# Patient Record
Sex: Female | Born: 1995 | Race: Black or African American | Hispanic: No | Marital: Single | Smoking: Never smoker
Health system: Southern US, Community
[De-identification: ages and names within clinical notes are randomized; demographics above are authoritative.]

## PROBLEM LIST (undated history)

## (undated) DIAGNOSIS — T7840XA Allergy, unspecified, initial encounter: Secondary | ICD-10-CM

## (undated) DIAGNOSIS — R87612 Low grade squamous intraepithelial lesion on cytologic smear of cervix (LGSIL): Principal | ICD-10-CM

## (undated) DIAGNOSIS — N390 Urinary tract infection, site not specified: Secondary | ICD-10-CM

## (undated) DIAGNOSIS — A749 Chlamydial infection, unspecified: Secondary | ICD-10-CM

## (undated) HISTORY — DX: Low grade squamous intraepithelial lesion on cytologic smear of cervix (LGSIL): R87.612

## (undated) HISTORY — DX: Chlamydial infection, unspecified: A74.9

## (undated) HISTORY — DX: Allergy, unspecified, initial encounter: T78.40XA

## (undated) HISTORY — PX: MYRINGOTOMY: SUR874

---
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
## 2015-04-03 ENCOUNTER — Encounter: Payer: Self-pay | Admitting: Primary Care

## 2015-04-03 ENCOUNTER — Ambulatory Visit (INDEPENDENT_AMBULATORY_CARE_PROVIDER_SITE_OTHER): Admitting: Primary Care

## 2015-04-03 VITALS — BP 116/68 | HR 87 | Temp 98.5°F | Ht 61.25 in | Wt 123.8 lb

## 2015-04-03 DIAGNOSIS — N898 Other specified noninflammatory disorders of vagina: Secondary | ICD-10-CM

## 2015-04-03 NOTE — Patient Instructions (Signed)
We will send off your specimens and notify you of the results. It may take 1-2 days.  I will be in touch with you once your results have returned.  It was a pleasure meeting you!

## 2015-04-03 NOTE — Progress Notes (Signed)
Pre visit review using our clinic review tool, if applicable. No additional management support is needed unless otherwise documented below in the visit note. 

## 2015-04-03 NOTE — Progress Notes (Signed)
   Subjective:    Patient ID: Rose Mitchell, female    DOB: 08/16/1995, [age] y.o.   MRN: [id number]  HPI  Rose Mitchell is a 19 year old female who presents today with a chief complaint of vaginal discharge. She also reports symptoms of vaginal itching. Her discharge is a milky white color. Denies fevers, urinary symptoms, abdominal pain. Her symptoms have been present for the past 3 days. She had unprotected intercourse Sunday this week.   Review of Systems  Constitutional: Negative for fever and chills.  Gastrointestinal: Negative for nausea and abdominal pain.  Genitourinary: Positive for vaginal discharge. Negative for dysuria and frequency.       Vaginal itching       Past Medical History  Diagnosis Date  . UTI (urinary tract infection)     Social History   Social History  . Marital Status: Single    Spouse Name: N/A  . Number of Children: N/A  . Years of Education: N/A   Occupational History  . Not on file.   Social History Main Topics  . Smoking status: Never Smoker   . Smokeless tobacco: Never Used  . Alcohol Use: No  . Drug Use: No  . Sexual Activity: No   Other Topics Concern  . Not on file   Social History Narrative    No past surgical history on file.  Family History  Problem Relation Age of Onset  . Diabetes Father     Allergies  Allergen Reactions  . Amoxicillin Rash    No current outpatient prescriptions on file prior to visit.   No current facility-administered medications on file prior to visit.    BP 116/68 mmHg  Pulse 87  Temp(Src) 98.5 F (36.9 C) (Oral)  Ht 5' 1.25" (1.556 m)  Wt 123 lb 12.8 oz (56.155 kg)  BMI 23.19 kg/m2  SpO2 99%  LMP 03/10/2015    Objective:   Physical Exam  Constitutional: She appears well-nourished.  Cardiovascular: Normal rate and regular rhythm.   Pulmonary/Chest: Effort normal and breath sounds normal.  Genitourinary: Cervix exhibits discharge. Cervix exhibits no motion tenderness. Vaginal discharge  found.  Skin: Skin is warm and dry.          Assessment & Plan:  Vaginal Discharge:  With vaginal itching x 3 days. Denies urinary symptoms, abdominal pain. Exam with whitish discharge to cervix. No CMT. Otherwise exam normal. Wet prep and GC/Chlamydia sent off and pending.  Discussed to hold intercourse until we speak again. Discussed safe protection during intercourse in the future.

## 2015-04-04 ENCOUNTER — Other Ambulatory Visit: Payer: Self-pay | Admitting: Primary Care

## 2015-04-04 DIAGNOSIS — B3731 Acute candidiasis of vulva and vagina: Secondary | ICD-10-CM

## 2015-04-04 DIAGNOSIS — B373 Candidiasis of vulva and vagina: Secondary | ICD-10-CM

## 2015-04-04 DIAGNOSIS — A749 Chlamydial infection, unspecified: Secondary | ICD-10-CM

## 2015-04-04 LAB — GC/CHLAMYDIA PROBE AMP
CT Probe RNA: POSITIVE — AB
GC PROBE AMP APTIMA: NEGATIVE

## 2015-04-04 LAB — WET PREP BY MOLECULAR PROBE
CANDIDA SPECIES: POSITIVE — AB
Gardnerella vaginalis: NEGATIVE
TRICHOMONAS VAG: NEGATIVE

## 2015-04-04 MED ORDER — FLUCONAZOLE 150 MG PO TABS: ORAL_TABLET | ORAL | Status: DC

## 2015-04-04 MED ORDER — AZITHROMYCIN 250 MG PO TABS: ORAL_TABLET | ORAL | Status: DC

## 2016-06-18 ENCOUNTER — Encounter: Payer: Self-pay | Admitting: Family Medicine

## 2016-06-18 ENCOUNTER — Ambulatory Visit: Admitting: Family Medicine

## 2016-06-18 VITALS — BP 120/70 | HR 81 | Temp 98.2°F | Ht 61.5 in | Wt 126.5 lb

## 2016-06-18 NOTE — Progress Notes (Unsigned)
   Subjective:   Patient ID: Rose Mitchell, female    DOB: 05/05/1996, 20 y.o.   MRN: [id number]  Rose Mitchell is a pleasant 21 y.o. year old female who presents to clinic today with Annual Exam  on 06/18/2016  HPI:   No current outpatient prescriptions on file prior to visit.   No current facility-administered medications on file prior to visit.     Allergies  Allergen Reactions  . Amoxicillin Rash    Past Medical History:  Diagnosis Date  . UTI (urinary tract infection)     No past surgical history on file.  Family History  Problem Relation Age of Onset  . Diabetes Father     Social History   Social History  . Marital status: Single    Spouse name: N/A  . Number of children: N/A  . Years of education: N/A   Occupational History  . Not on file.   Social History Main Topics  . Smoking status: Never Smoker  . Smokeless tobacco: Never Used  . Alcohol use No  . Drug use: No  . Sexual activity: No   Other Topics Concern  . Not on file   Social History Narrative  . No narrative on file   The PMH, PSH, Social History, Family History, Medications, and allergies [facility], and have been updated if relevant.   Review of Systems  Constitutional: Negative.   HENT: Negative.   Eyes: Negative.   Respiratory: Negative.   Cardiovascular: Negative.   Gastrointestinal: Negative.   Endocrine: Negative.   Genitourinary: Negative.   Musculoskeletal: Negative.   Skin: Negative.   Allergic/Immunologic: Negative.   Neurological: Negative.   Hematological: Negative.   Psychiatric/Behavioral: Negative.   All other systems reviewed and are negative.      Objective:    BP 120/70   Pulse 81   Temp 98.2 F (36.8 C) (Oral)   Ht 5' 1.5" (1.562 m)   Wt 126 lb 8 oz (57.4 kg)   LMP 06/17/2016   SpO2 99%   BMI 23.51 kg/m    Physical Exam   General:  Well-developed,well-nourished,in no acute distress; alert,appropriate and cooperative throughout  examination Head:  normocephalic and atraumatic.   Eyes:  vision grossly intact, PERRL Ears:  R ear normal and L ear normal externally, TMs clear bilaterally Nose:  no external deformity.   Mouth:  good dentition.   Neck:  No deformities, masses, or tenderness noted. Lungs:  Normal respiratory effort, chest expands symmetrically. Lungs are clear to auscultation, no crackles or wheezes. Heart:  Normal rate and regular rhythm. S1 and S2 normal without gallop, murmur, click, rub or other extra sounds. Abdomen:  Bowel sounds positive,abdomen soft and non-tender without masses, organomegaly or hernias noted. Msk:  No deformity or scoliosis noted of thoracic or lumbar spine.   Extremities:  No clubbing, cyanosis, edema, or deformity noted with normal full range of motion of all joints.   Neurologic:  alert & oriented X3 and gait normal.   Skin:  Intact without suspicious lesions or rashes Cervical Nodes:  No lymphadenopathy noted Axillary Nodes:  No palpable lymphadenopathy Psych:  Cognition and judgment appear intact. Alert and cooperative with normal attention span and concentration. No apparent delusions, illusions, hallucinations       Assessment & Plan:   Routine general medical examination at a health care facility No Follow-up on file.

## 2016-06-18 NOTE — Patient Instructions (Signed)
Great to see you.  Please schedule an appointment next week.

## 2016-06-22 ENCOUNTER — Encounter: Payer: Self-pay | Admitting: Family Medicine

## 2016-06-22 ENCOUNTER — Ambulatory Visit (INDEPENDENT_AMBULATORY_CARE_PROVIDER_SITE_OTHER): Admitting: Family Medicine

## 2016-06-22 VITALS — BP 128/64 | HR 64 | Temp 98.0°F | Ht 61.25 in | Wt 124.0 lb

## 2016-06-22 DIAGNOSIS — Z0000 Encounter for general adult medical examination without abnormal findings: Secondary | ICD-10-CM | POA: Diagnosis not present

## 2016-06-22 DIAGNOSIS — N761 Subacute and chronic vaginitis: Secondary | ICD-10-CM

## 2016-06-22 DIAGNOSIS — Z0001 Encounter for general adult medical examination with abnormal findings: Secondary | ICD-10-CM

## 2016-06-22 DIAGNOSIS — N760 Acute vaginitis: Secondary | ICD-10-CM | POA: Insufficient documentation

## 2016-06-22 LAB — CBC WITH DIFFERENTIAL/PLATELET
BASOS ABS: 0.1 10*3/uL (ref 0.0–0.1)
Basophils Relative: 0.8 % (ref 0.0–3.0)
EOS ABS: 0.1 10*3/uL (ref 0.0–0.7)
Eosinophils Relative: 1.8 % (ref 0.0–5.0)
HCT: 38.8 % (ref 36.0–46.0)
Hemoglobin: 12.7 g/dL (ref 12.0–15.0)
LYMPHS ABS: 2.5 10*3/uL (ref 0.7–4.0)
Lymphocytes Relative: 36.7 % (ref 12.0–46.0)
MCHC: 32.6 g/dL (ref 30.0–36.0)
MCV: 91 fl (ref 78.0–100.0)
Monocytes Absolute: 0.3 10*3/uL (ref 0.1–1.0)
Monocytes Relative: 5.1 % (ref 3.0–12.0)
NEUTROS PCT: 55.6 % (ref 43.0–77.0)
Neutro Abs: 3.8 10*3/uL (ref 1.4–7.7)
Platelets: 407 10*3/uL — ABNORMAL HIGH (ref 150.0–400.0)
RBC: 4.27 Mil/uL (ref 3.87–5.11)
RDW: 13.6 % (ref 11.5–14.6)
WBC: 6.8 10*3/uL (ref 4.5–10.5)

## 2016-06-22 LAB — LIPID PANEL
Cholesterol: 151 mg/dL (ref 0–200)
HDL: 71 mg/dL (ref >39.00)
LDL Cholesterol: 72 mg/dL (ref 0–99)
NONHDL: 80.14
Total CHOL/HDL Ratio: 2
Triglycerides: 39 mg/dL (ref 0.0–149.0)
VLDL: 7.8 mg/dL (ref 0.0–40.0)

## 2016-06-22 LAB — COMPREHENSIVE METABOLIC PANEL
ALK PHOS: 55 U/L (ref 39–117)
ALT: 15 U/L (ref 0–35)
AST: 25 U/L (ref 0–37)
Albumin: 4.6 g/dL (ref 3.5–5.2)
BILIRUBIN TOTAL: 0.7 mg/dL (ref 0.2–1.2)
BUN: 13 mg/dL (ref 6–23)
CO2: 28 mEq/L (ref 19–32)
Calcium: 9.7 mg/dL (ref 8.4–10.5)
Chloride: 100 mEq/L (ref 96–112)
Creatinine, Ser: 0.71 mg/dL (ref 0.40–1.20)
GFR: 134.09 mL/min (ref >60.00)
GLUCOSE: 97 mg/dL (ref 70–99)
POTASSIUM: 3.9 meq/L (ref 3.5–5.1)
SODIUM: 136 meq/L (ref 135–145)
TOTAL PROTEIN: 8 g/dL (ref 6.0–8.3)

## 2016-06-22 LAB — TSH: TSH: 1.42 u[IU]/mL (ref 0.35–5.50)

## 2016-06-22 NOTE — Assessment & Plan Note (Signed)
Reviewed preventive care protocols, scheduled due services, and updated immunizations Discussed nutrition, exercise, diet, and healthy lifestyle.  Orders Placed This Encounter  Procedures  . WET PREP BY MOLECULAR PROBE  . GC/Chlamydia Probe Amp  . CBC with Differential/Platelet  . Comprehensive metabolic panel  . Lipid panel  . TSH  . HIV antibody (with reflex)

## 2016-06-22 NOTE — Progress Notes (Signed)
Pre visit review using our clinic review tool, if applicable. No additional management support is needed unless otherwise documented below in the visit note. 

## 2016-06-22 NOTE — Patient Instructions (Signed)
Great to see you. We will call you with your results from today. 

## 2016-06-22 NOTE — Progress Notes (Signed)
Subjective:   Patient ID: Rose Mitchell, female    DOB: 12/26/1995, 20 y.o.   MRN: [id number]  Rose Mitchell is a pleasant 21 y.o. year old female who presents to clinic today with Annual Exam and retest (BV)  on 06/22/2016  HPI:  Doing well.  H/o BV- having more vaginal discharge lately. Wants to be retested.  She is sexually active although has not been recently.   No current outpatient prescriptions on file prior to visit.   No current facility-administered medications on file prior to visit.     Allergies  Allergen Reactions  . Amoxicillin Rash    Past Medical History:  Diagnosis Date  . UTI (urinary tract infection)     No past surgical history on file.  Family History  Problem Relation Age of Onset  . Diabetes Father     Social History   Social History  . Marital status: Single    Spouse name: N/A  . Number of children: N/A  . Years of education: N/A   Occupational History  . Not on file.   Social History Main Topics  . Smoking status: Never Smoker  . Smokeless tobacco: Never Used  . Alcohol use No  . Drug use: No  . Sexual activity: No   Other Topics Concern  . Not on file   Social History Narrative  . No narrative on file   The PMH, PSH, Social History, Family History, Medications, and allergies [facility], and have been updated if relevant.   Review of Systems  Constitutional: Negative.   HENT: Negative.   Eyes: Negative.   Respiratory: Negative.   Cardiovascular: Negative.   Gastrointestinal: Negative.   Endocrine: Negative.   Genitourinary: Positive for vaginal discharge. Negative for decreased urine volume, vaginal bleeding and vaginal pain.  Musculoskeletal: Negative.   Allergic/Immunologic: Negative.   Neurological: Negative.   Hematological: Negative.   Psychiatric/Behavioral: Negative.   All other systems reviewed and are negative.      Objective:    BP 128/64   Pulse 64   Temp 98 F (36.7 C) (Oral)    Ht 5' 1.25" (1.556 m)   Wt 124 lb (56.2 kg)   LMP 06/17/2016   SpO2 98%   BMI 23.24 kg/m    Physical Exam   General:  Well-developed,well-nourished,in no acute distress; alert,appropriate and cooperative throughout examination Head:  normocephalic and atraumatic.   Eyes:  vision grossly intact, PERRL Ears:  R ear normal and L ear normal externally, TMs clear bilaterally Nose:  no external deformity.   Mouth:  good dentition.   Neck:  No deformities, masses, or tenderness noted. Breasts:  No mass, nodules, thickening, tenderness, bulging, retraction, inflamation, nipple discharge or skin changes noted.   Lungs:  Normal respiratory effort, chest expands symmetrically. Lungs are clear to auscultation, no crackles or wheezes. Heart:  Normal rate and regular rhythm. S1 and S2 normal without gallop, murmur, click, rub or other extra sounds. Abdomen:  Bowel sounds positive,abdomen soft and non-tender without masses, organomegaly or hernias noted. Rectal:  no external abnormalities.   Genitalia:  Pelvic Exam:        External: normal female genitalia without lesions or masses        Vagina: normal without lesions or masses        Cervix: normal without lesions or masses, some thick discharge present        Adnexa: normal bimanual exam without masses or fullness  Uterus: normal by palpation         Msk:  No deformity or scoliosis noted of thoracic or lumbar spine.   Extremities:  No clubbing, cyanosis, edema, or deformity noted with normal full range of motion of all joints.   Neurologic:  alert & oriented X3 and gait normal.   Skin:  Intact without suspicious lesions or rashes Cervical Nodes:  No lymphadenopathy noted Axillary Nodes:  No palpable lymphadenopathy Psych:  Cognition and judgment appear intact. Alert and cooperative with normal attention span and concentration. No apparent delusions, illusions, hallucinations       Assessment & Plan:   Routine general medical  examination at a health care facility - Plan: CBC with Differential/Platelet, Comprehensive metabolic panel, Lipid panel, TSH, HIV antibody (with reflex)  Subacute vaginitis No Follow-up on file.

## 2016-06-22 NOTE — Assessment & Plan Note (Signed)
New- wet prep/gc and chlamydia cervical swabs obtained today.

## 2016-06-23 LAB — GC/CHLAMYDIA PROBE AMP
CT PROBE, AMP APTIMA: NOT DETECTED
GC Probe RNA: NOT DETECTED

## 2016-06-23 LAB — HIV ANTIBODY (ROUTINE TESTING W REFLEX): HIV: NONREACTIVE

## 2016-06-23 LAB — WET PREP BY MOLECULAR PROBE
CANDIDA SPECIES: NEGATIVE
Gardnerella vaginalis: NEGATIVE
TRICHOMONAS VAG: NEGATIVE

## 2016-06-25 ENCOUNTER — Encounter: Payer: Self-pay | Admitting: *Deleted

## 2016-06-29 ENCOUNTER — Telehealth: Payer: Self-pay | Admitting: Family Medicine

## 2016-07-17 NOTE — Telephone Encounter (Signed)
Opened in error

## 2016-10-07 ENCOUNTER — Ambulatory Visit: Admitting: Family Medicine

## 2016-10-07 DIAGNOSIS — Z0289 Encounter for other administrative examinations: Secondary | ICD-10-CM

## 2016-10-08 ENCOUNTER — Ambulatory Visit (INDEPENDENT_AMBULATORY_CARE_PROVIDER_SITE_OTHER): Admitting: Family Medicine

## 2016-10-08 ENCOUNTER — Ambulatory Visit: Admitting: Family Medicine

## 2016-10-08 DIAGNOSIS — N760 Acute vaginitis: Secondary | ICD-10-CM

## 2016-10-08 NOTE — Assessment & Plan Note (Signed)
Pt does not want repeat testing today. Prefers GYN referral. Leave for [employer] training next Friday. Will place urgent GYN referral today.

## 2016-10-08 NOTE — Patient Instructions (Signed)
Great to see you. Please stop by to see Rose Mitchell on your way out.   

## 2016-10-08 NOTE — Progress Notes (Deleted)
   Subjective:   Patient ID: Rose Mitchell, female    DOB: 04/02/1996, 20 y.o.   MRN: [id number]  Rose Mitchell is a pleasant 21 y.o. year old female who presents to clinic today with No chief complaint on file.  on 10/08/2016  HPI:     No current outpatient prescriptions on file prior to visit.   No current facility-administered medications on file prior to visit.     Allergies  Allergen Reactions  . Amoxicillin Rash    Past Medical History:  Diagnosis Date  . UTI (urinary tract infection)     No past surgical history on file.  Family History  Problem Relation Age of Onset  . Diabetes Father     Social History   Social History  . Marital status: Single    Spouse name: N/A  . Number of children: N/A  . Years of education: N/A   Occupational History  . Not on file.   Social History Main Topics  . Smoking status: Never Smoker  . Smokeless tobacco: Never Used  . Alcohol use No  . Drug use: No  . Sexual activity: No   Other Topics Concern  . Not on file   Social History Narrative  . No narrative on file   The PMH, PSH, Social History, Family History, Medications, and allergies [facility], and have been updated if relevant.   Review of Systems     Objective:    Ht 5' 1.5" (1.562 m)   Wt 126 lb (57.2 kg)   BMI 23.42 kg/m    Physical Exam        Assessment & Plan:   No diagnosis found. No Follow-up on file.

## 2016-10-08 NOTE — Progress Notes (Signed)
SUBJECTIVE:  21 y.o. female complains of intermittent but rather consistent vaginal itching and odor since 05/2016.  Wet prep and GC/Chlamydia were neg in 05/2016 and symptoms have not improved since I last saw her.   Denies abnormal vaginal bleeding or significant pelvic pain or fever. No UTI symptoms. Denies history of known exposure to STD.  Patient's last menstrual period was 09/09/2016.  No current outpatient prescriptions on file prior to visit.   No current facility-administered medications on file prior to visit.     Allergies  Allergen Reactions  . Amoxicillin Rash    Past Medical History:  Diagnosis Date  . UTI (urinary tract infection)     No past surgical history on file.  Family History  Problem Relation Age of Onset  . Diabetes Father     Social History   Social History  . Marital status: Single    Spouse name: N/A  . Number of children: N/A  . Years of education: N/A   Occupational History  . Not on file.   Social History Main Topics  . Smoking status: Never Smoker  . Smokeless tobacco: Never Used  . Alcohol use No  . Drug use: No  . Sexual activity: No   Other Topics Concern  . Not on file   Social History Narrative  . No narrative on file   The PMH, PSH, Social History, Family History, Medications, and allergies have been [facility], and have been updated if relevant.  OBJECTIVE:  BP 120/80   Pulse 75   Ht 5' 1.5" (1.562 m)   Wt 126 lb (57.2 kg)   LMP 09/09/2016   SpO2 99%   BMI 23.42 kg/m    General:  Well-developed,well-nourished,in no acute distress; alert,appropriate and cooperative throughout examination Head:  normocephalic and atraumatic.   Eyes:  vision grossly intact, PERRL Ears:  R ear normal and L ear normal externally, TMs clear bilaterally Nose:  no external deformity.   Mouth:  good dentition.   Neck:  No deformities, masses, or tenderness noted. Lungs:  Normal respiratory effort, chest expands  symmetrically. Lungs are clear to auscultation, no crackles or wheezes. Heart:  Normal rate and regular rhythm. S1 and S2 normal without gallop, murmur, click, rub or other extra sounds. Msk:  No deformity or scoliosis noted of thoracic or lumbar spine.   Extremities:  No clubbing, cyanosis, edema, or deformity noted with normal full range of motion of all joints.   Neurologic:  alert & oriented X3 and gait normal.   Skin:  Intact without suspicious lesions or rashes Psych:  Cognition and judgment appear intact. Alert and cooperative with normal attention span and concentration. No apparent delusions, illusions, hallucinations

## 2017-04-20 ENCOUNTER — Ambulatory Visit (INDEPENDENT_AMBULATORY_CARE_PROVIDER_SITE_OTHER): Admitting: Internal Medicine

## 2017-04-20 ENCOUNTER — Encounter: Payer: Self-pay | Admitting: Internal Medicine

## 2017-04-20 ENCOUNTER — Ambulatory Visit: Admitting: Internal Medicine

## 2017-04-20 VITALS — BP 112/78 | HR 76 | Temp 98.1°F | Wt 130.0 lb

## 2017-04-20 DIAGNOSIS — N898 Other specified noninflammatory disorders of vagina: Secondary | ICD-10-CM

## 2017-04-20 NOTE — Addendum Note (Signed)
Addended by: Roena MaladyEVONTENNO, Copper Basnett Y on: 04/20/2017 04:20 PM   Modules accepted: Orders

## 2017-04-20 NOTE — Progress Notes (Signed)
Subjective:    Patient ID: Rose Mitchell, female    DOB: 03/31/1996, 21 y.o.   MRN: [id number]  HPI  Pt presents to the clinic today with c/o vaginal discharge and itching. She reports this started 5 days ago. The discharge is thin and white, with a bad odor. She denies pelvic pain or abnormal bleeding. She is sexually active. She has not tried anything OTC.  Review of Systems      Past Medical History:  Diagnosis Date  . UTI (urinary tract infection)     No current outpatient medications on file.   No current facility-administered medications for this visit.     Allergies  Allergen Reactions  . Amoxicillin Rash    Family History  Problem Relation Age of Onset  . Diabetes Father     Social History   Socioeconomic History  . Marital status: Single    Spouse name: Not on file  . Number of children: Not on file  . Years of education: Not on file  . Highest education level: Not on file  Social Needs  . Financial resource strain: Not on file  . Food insecurity - worry: Not on file  . Food insecurity - inability: Not on file  . Transportation needs - medical: Not on file  . Transportation needs - non-medical: Not on file  Occupational History  . Not on file  Tobacco Use  . Smoking status: Never Smoker  . Smokeless tobacco: Never Used  Substance and Sexual Activity  . Alcohol use: No    Alcohol/week: 0.0 oz  . Drug use: No  . Sexual activity: No  Other Topics Concern  . Not on file  Social History Narrative  . Not on file     Constitutional: Denies fever, malaise, fatigue, headache or abrupt weight changes.  Gastrointestinal: Denies abdominal pain, bloating, constipation, diarrhea or blood in the stool.  GU: Pt reports vaginal discharge and itching. Denies urgency, frequency, pain with urination, burning sensation, blood in urine, odor.  No other specific complaints in a complete review of systems (except as listed in HPI above).  Objective:   Physical  Exam   BP 112/78   Pulse 76   Temp 98.1 F (36.7 C) (Oral)   Wt 130 lb (59 kg)   LMP 04/07/2017   SpO2 98%   BMI 24.17 kg/m  Wt Readings from Last 3 Encounters:  04/20/17 130 lb (59 kg)  10/08/16 126 lb (57.2 kg)  06/22/16 124 lb (56.2 kg)    General: Appears her stated age, well developed, well nourished in NAD. Abdomen: Soft and nontender. Normal bowel sounds. No distention or masses noted.  Pelvic: Normal female anatomy. Large amount of thin white discharge noted. No odor.  BMET    Component Value Date/Time   NA 136 06/22/2016 1158   K 3.9 06/22/2016 1158   CL 100 06/22/2016 1158   CO2 28 06/22/2016 1158   GLUCOSE 97 06/22/2016 1158   BUN 13 06/22/2016 1158   CREATININE 0.71 06/22/2016 1158   CALCIUM 9.7 06/22/2016 1158   GFRNONAA NOT CALCULATED 04/11/2012 0614   GFRAA NOT CALCULATED 04/11/2012 0614    Lipid Panel     Component Value Date/Time   CHOL 151 06/22/2016 1158   TRIG 39.0 06/22/2016 1158   HDL 71.00 06/22/2016 1158   CHOLHDL 2 06/22/2016 1158   VLDL 7.8 06/22/2016 1158   LDLCALC 72 06/22/2016 1158    CBC    Component Value Date/Time  WBC 6.8 06/22/2016 1158   RBC 4.27 06/22/2016 1158   HGB 12.7 06/22/2016 1158   HCT 38.8 06/22/2016 1158   PLT 407.0 (H) 06/22/2016 1158   MCV 91.0 06/22/2016 1158   MCH 29.4 04/11/2012 0614   MCHC 32.6 06/22/2016 1158   RDW 13.6 06/22/2016 1158   LYMPHSABS 2.5 06/22/2016 1158   MONOABS 0.3 06/22/2016 1158   EOSABS 0.1 06/22/2016 1158   BASOSABS 0.1 06/22/2016 1158    Hgb A1C No results found for: HGBA1C         Assessment & Plan:   Vaginal Discharge and Itching:  Wet mount without abnormal findings Will send wet prep to lab Urine gonorrhea and chlamydia   Will call you once lab results are back, return precautions discussed Nicki ReaperBAITY, REGINA, NP

## 2017-04-20 NOTE — Patient Instructions (Signed)

## 2017-04-21 ENCOUNTER — Other Ambulatory Visit: Payer: Self-pay | Admitting: Internal Medicine

## 2017-04-21 LAB — C. TRACHOMATIS/N. GONORRHOEAE RNA
C. TRACHOMATIS RNA, TMA: DETECTED — AB
N. GONORRHOEAE RNA, TMA: NOT DETECTED

## 2017-04-21 LAB — WET PREP BY MOLECULAR PROBE
CANDIDA SPECIES: DETECTED — AB
MICRO NUMBER: 81330376
SPECIMEN QUALITY:: ADEQUATE
Trichomonas vaginosis: NOT DETECTED

## 2017-04-21 MED ORDER — FLUCONAZOLE 150 MG PO TABS: 150.0000 mg | ORAL_TABLET | Freq: Once | ORAL | 0 refills | Status: AC

## 2017-04-21 MED ORDER — AZITHROMYCIN 500 MG PO TABS: 1000.0000 mg | ORAL_TABLET | Freq: Once | ORAL | 0 refills | Status: AC

## 2017-04-21 MED ORDER — METRONIDAZOLE 0.75 % EX GEL: 1.0000 "application " | Freq: Two times a day (BID) | CUTANEOUS | 0 refills | Status: DC

## 2017-04-21 MED ORDER — AZITHROMYCIN 1 G PO PACK: 1.0000 g | PACK | Freq: Once | ORAL | 0 refills | Status: DC

## 2017-04-21 NOTE — Addendum Note (Signed)
Addended by: Dago Jungwirth Y on: 04/21/2017 05:14 PM   Modules accepted: Orders  

## 2017-07-19 ENCOUNTER — Ambulatory Visit (INDEPENDENT_AMBULATORY_CARE_PROVIDER_SITE_OTHER): Payer: Self-pay | Admitting: Internal Medicine

## 2017-07-19 ENCOUNTER — Telehealth: Payer: Self-pay | Admitting: *Deleted

## 2017-07-19 ENCOUNTER — Encounter: Payer: Self-pay | Admitting: Internal Medicine

## 2017-07-19 VITALS — BP 118/70 | HR 89 | Temp 98.5°F | Ht 61.5 in | Wt 138.1 lb

## 2017-07-19 DIAGNOSIS — N926 Irregular menstruation, unspecified: Secondary | ICD-10-CM

## 2017-07-19 DIAGNOSIS — N941 Unspecified dyspareunia: Secondary | ICD-10-CM

## 2017-07-19 DIAGNOSIS — Z3201 Encounter for pregnancy test, result positive: Secondary | ICD-10-CM

## 2017-07-19 LAB — POCT URINE PREGNANCY: Preg Test, Ur: POSITIVE — AB

## 2017-07-19 NOTE — Progress Notes (Signed)
Subjective:    Patient ID: Rose Mitchell, female    DOB: April 19, 1996, 22 y.o.   MRN: [id number]  HPI  Pt presents to the clinic today with c/o painful intercourse and missed period. She started noticing that she was having pain with intercourse about 2 weeks ago. She denies vaginal discharge, odor or abnormal bleeding. She denies urgency, frequency, dysuria or blood in her urine. She is sexually active. Her LMP was 05/09/18. She is not on OCP.  Review of Systems   Past Medical History:  Diagnosis Date  . UTI (urinary tract infection)     No current outpatient medications on file.   No current facility-administered medications for this visit.     Allergies  Allergen Reactions  . Amoxicillin Rash    Family History  Problem Relation Age of Onset  . Diabetes Father     Social History   Socioeconomic History  . Marital status: Single    Spouse name: Not on file  . Number of children: Not on file  . Years of education: Not on file  . Highest education level: Not on file  Social Needs  . Financial resource strain: Not on file  . Food insecurity - worry: Not on file  . Food insecurity - inability: Not on file  . Transportation needs - medical: Not on file  . Transportation needs - non-medical: Not on file  Occupational History  . Not on file  Tobacco Use  . Smoking status: Never Smoker  . Smokeless tobacco: Never Used  Substance and Sexual Activity  . Alcohol use: No    Alcohol/week: 0.0 oz  . Drug use: No  . Sexual activity: No  Other Topics Concern  . Not on file  Social History Narrative  . Not on file     Constitutional: Denies fever, malaise, fatigue, headache or abrupt weight changes.   Respiratory: Denies difficulty breathing, shortness of breath, cough or sputum production.   Cardiovascular: Denies chest pain, chest tightness, palpitations or swelling in the hands or feet.  Gastrointestinal: Denies abdominal pain, bloating, constipation, diarrhea or  blood in the stool.  GU: Pt reports missed period and pain with intercourse. Denies urgency, frequency, pain with urination, burning sensation, blood in urine, odor or discharge.  No other specific complaints in a complete review of systems (except as listed in HPI above).  Objective:   Physical Exam   BP 118/70 (BP Location: Left Arm, Patient Position: Sitting, Cuff Size: Normal)   Pulse 89   Temp 98.5 F (36.9 C) (Oral)   Ht 5' 1.5" (1.562 m)   Wt 138 lb 1.9 oz (62.7 kg)   LMP 05/11/2017   SpO2 98%   BMI 25.67 kg/m  Wt Readings from Last 3 Encounters:  07/19/17 138 lb 1.9 oz (62.7 kg)  04/20/17 130 lb (59 kg)  10/08/16 126 lb (57.2 kg)    General: Appears her stated age, well developed, well nourished in NAD. Cardiovascular: Normal rate and rhythm.  Pulmonary/Chest: Normal effort and positive vesicular breath sounds. No respiratory distress. No wheezes, rales or ronchi noted.  Abdomen: Soft and nontender. Normal bowel sounds. Unable to palpate uterus.  BMET    Component Value Date/Time   NA 136 06/22/2016 1158   K 3.9 06/22/2016 1158   CL 100 06/22/2016 1158   CO2 28 06/22/2016 1158   GLUCOSE 97 06/22/2016 1158   BUN 13 06/22/2016 1158   CREATININE 0.71 06/22/2016 1158   CALCIUM 9.7 06/22/2016 1158  GFRNONAA NOT CALCULATED 04/11/2012 0614   GFRAA NOT CALCULATED 04/11/2012 0614    Lipid Panel     Component Value Date/Time   CHOL 151 06/22/2016 1158   TRIG 39.0 06/22/2016 1158   HDL 71.00 06/22/2016 1158   CHOLHDL 2 06/22/2016 1158   VLDL 7.8 06/22/2016 1158   LDLCALC 72 06/22/2016 1158    CBC    Component Value Date/Time   WBC 6.8 06/22/2016 1158   RBC 4.27 06/22/2016 1158   HGB 12.7 06/22/2016 1158   HCT 38.8 06/22/2016 1158   PLT 407.0 (H) 06/22/2016 1158   MCV 91.0 06/22/2016 1158   MCH 29.4 04/11/2012 0614   MCHC 32.6 06/22/2016 1158   RDW 13.6 06/22/2016 1158   LYMPHSABS 2.5 06/22/2016 1158   MONOABS 0.3 06/22/2016 1158   EOSABS 0.1  06/22/2016 1158   BASOSABS 0.1 06/22/2016 1158    Hgb A1C No results found for: HGBA1C         Assessment & Plan:   Missed Period, Dyspareunia:  Urine HcG positive She is not interested in keeping this pregnancy, [facility] Did discuss possible dates of ovulation, inception Discussed care in first trimester, prenatal vitamins, but she declines at this time  [facility] Nicki ReaperBAITY, Darrious Youman, NP

## 2017-07-19 NOTE — Patient Instructions (Signed)
First Trimester of Pregnancy The first trimester of pregnancy is from week 1 until the end of week 13 (months 1 through 3). During this time, your baby will begin to develop inside you. At 6-8 weeks, the eyes and face are formed, and the heartbeat can be seen on ultrasound. At the end of 12 weeks, all the baby's organs are formed. Prenatal care is all the medical care you receive before the birth of your baby. Make sure you get good prenatal care and follow all of your doctor's instructions. Follow these instructions at home: Medicines  Take over-the-counter and prescription medicines only as told by your doctor. Some medicines are safe and some medicines are not safe during pregnancy.  Take a prenatal vitamin that contains at least 600 micrograms (mcg) of folic acid.  If you have trouble pooping (constipation), take medicine that will make your stool soft (stool softener) if your doctor approves. Eating and drinking  Eat regular, healthy meals.  Your doctor will tell you the amount of weight gain that is right for you.  Avoid raw meat and uncooked cheese.  If you feel sick to your stomach (nauseous) or throw up (vomit): ? Eat 4 or 5 small meals a day instead of 3 large meals. ? Try eating a few soda crackers. ? Drink liquids between meals instead of during meals.  To prevent constipation: ? Eat foods that are high in fiber, like fresh fruits and vegetables, whole grains, and beans. ? Drink enough fluids to keep your pee (urine) clear or pale yellow. Activity  Exercise only as told by your doctor. Stop exercising if you have cramps or pain in your lower belly (abdomen) or low back.  Do not exercise if it is too hot, too humid, or if you are in a place of great height (high altitude).  Try to avoid standing for long periods of time. Move your legs often if you must stand in one place for a long time.  Avoid heavy lifting.  Wear low-heeled shoes. Sit and stand up straight.  You  can have sex unless your doctor tells you not to. Relieving pain and discomfort  Wear a good support bra if your breasts are sore.  Take warm water baths (sitz baths) to soothe pain or discomfort caused by hemorrhoids. Use hemorrhoid cream if your doctor says it is okay.  Rest with your legs raised if you have leg cramps or low back pain.  If you have puffy, bulging veins (varicose veins) in your legs: ? Wear support hose or compression stockings as told by your doctor. ? Raise (elevate) your feet for 15 minutes, 3-4 times a day. ? Limit salt in your food. Prenatal care  Schedule your prenatal visits by the twelfth week of pregnancy.  Write down your questions. Take them to your prenatal visits.  Keep all your prenatal visits as told by your doctor. This is important. Safety  Wear your seat belt at all times when driving.  Make a list of emergency phone numbers. The list should include numbers for family, friends, the hospital, and police and fire departments. General instructions  Ask your doctor for a referral to a local prenatal class. Begin classes no later than at the start of month 6 of your pregnancy.  Ask for help if you need counseling or if you need help with nutrition. Your doctor can give you advice or tell you where to go for help.  Do not use hot tubs, steam rooms, or   saunas.  Do not douche or use tampons or scented sanitary pads.  Do not cross your legs for long periods of time.  Avoid all herbs and alcohol. Avoid drugs that are not approved by your doctor.  Do not use any tobacco products, including cigarettes, chewing tobacco, and electronic cigarettes. If you need help quitting, ask your doctor. You may get counseling or other support to help you quit.  Avoid cat litter boxes and soil used by cats. These carry germs that can cause birth defects in the baby and can cause a loss of your baby (miscarriage) or stillbirth.  Visit your dentist. At home, brush  your teeth with a soft toothbrush. Be gentle when you floss. Contact a doctor if:  You are dizzy.  You have mild cramps or pressure in your lower belly.  You have a nagging pain in your belly area.  You continue to feel sick to your stomach, you throw up, or you have watery poop (diarrhea).  You have a bad smelling fluid coming from your vagina.  You have pain when you pee (urinate).  You have increased puffiness (swelling) in your face, hands, legs, or ankles. Get help right away if:  You have a fever.  You are leaking fluid from your vagina.  You have spotting or bleeding from your vagina.  You have very bad belly cramping or pain.  You gain or lose weight rapidly.  You throw up blood. It may look like coffee grounds.  You are around people who have German measles, fifth disease, or chickenpox.  You have a very bad headache.  You have shortness of breath.  You have any kind of trauma, such as from a fall or a car accident. Summary  The first trimester of pregnancy is from week 1 until the end of week 13 (months 1 through 3).  To take care of yourself and your unborn baby, you will need to eat healthy meals, take medicines only if your doctor tells you to do so, and do activities that are safe for you and your baby.  Keep all follow-up visits as told by your doctor. This is important as your doctor will have to ensure that your baby is healthy and growing well. This information is not intended to replace advice given to you by your health care provider. Make sure you discuss any questions you have with your health care provider. Document Released: 10/28/2007 Document Revised: 05/19/2016 Document Reviewed: 05/19/2016 Elsevier Interactive Patient Education  2017 [employer]  

## 2017-07-19 NOTE — Telephone Encounter (Signed)
Patient called requesting that Nicki Reaperegina Baity NP call her back about her visit today.

## 2017-07-19 NOTE — Telephone Encounter (Signed)
Called pt back and discussed her concerns 

## 2017-08-26 ENCOUNTER — Encounter: Payer: Self-pay | Admitting: Internal Medicine

## 2017-08-26 ENCOUNTER — Ambulatory Visit: Payer: Self-pay | Admitting: Internal Medicine

## 2017-08-26 ENCOUNTER — Ambulatory Visit (INDEPENDENT_AMBULATORY_CARE_PROVIDER_SITE_OTHER): Payer: Self-pay | Admitting: Internal Medicine

## 2017-08-26 VITALS — BP 106/68 | HR 88 | Temp 98.2°F | Ht 61.5 in | Wt 142.0 lb

## 2017-08-26 DIAGNOSIS — Z0000 Encounter for general adult medical examination without abnormal findings: Secondary | ICD-10-CM

## 2017-08-26 LAB — COMPREHENSIVE METABOLIC PANEL
ALT: 16 U/L (ref 0–35)
AST: 24 U/L (ref 0–37)
Albumin: 4 g/dL (ref 3.5–5.2)
Alkaline Phosphatase: 61 U/L (ref 39–117)
BUN: 15 mg/dL (ref 6–23)
CO2: 27 meq/L (ref 19–32)
Calcium: 9.8 mg/dL (ref 8.4–10.5)
Chloride: 102 mEq/L (ref 96–112)
Creatinine, Ser: 0.69 mg/dL (ref 0.40–1.20)
GFR: 137.03 mL/min (ref >60.00)
GLUCOSE: 92 mg/dL (ref 70–99)
POTASSIUM: 4.1 meq/L (ref 3.5–5.1)
SODIUM: 135 meq/L (ref 135–145)
Total Bilirubin: 0.2 mg/dL (ref 0.2–1.2)
Total Protein: 7.6 g/dL (ref 6.0–8.3)

## 2017-08-26 LAB — CBC
HEMATOCRIT: 37.6 % (ref 36.0–46.0)
Hemoglobin: 12.5 g/dL (ref 12.0–15.0)
MCHC: 33.2 g/dL (ref 30.0–36.0)
MCV: 88.4 fl (ref 78.0–100.0)
Platelets: 383 10*3/uL (ref 150.0–400.0)
RBC: 4.25 Mil/uL (ref 3.87–5.11)
RDW: 13.2 % (ref 11.5–15.5)
WBC: 8.6 10*3/uL (ref 4.0–10.5)

## 2017-08-26 LAB — LIPID PANEL
CHOL/HDL RATIO: 3
Cholesterol: 123 mg/dL (ref 0–200)
HDL: 47.7 mg/dL (ref >39.00)
LDL Cholesterol: 53 mg/dL (ref 0–99)
NonHDL: 75.22
Triglycerides: 111 mg/dL (ref 0.0–149.0)
VLDL: 22.2 mg/dL (ref 0.0–40.0)

## 2017-08-26 NOTE — Progress Notes (Signed)
Subjective:    Patient ID: Rose Mitchell, female    DOB: 08/08/1995, 22 y.o.   MRN: [id number]  HPI  Pt presents to the clinic today for her annual exam. She is a transfer from Dr. Dayton MartesAron that is establishing care with me today.  Flu: 02/2016 Tetanus: 12/2006 Pap Smear: never Dentist: biannually  Diet: She does eat meat. She consumes fruits and veggies daily. She does eat fried foods. She drinks mostly water. Exercise: She lifts and runs 5 days per week.   Review of Systems      Past Medical History:  Diagnosis Date  . UTI (urinary tract infection)     No current outpatient medications on file.   No current facility-administered medications for this visit.     Allergies  Allergen Reactions  . Amoxicillin Rash    Family History  Problem Relation Age of Onset  . Diabetes Father     Social History   Socioeconomic History  . Marital status: Single    Spouse name: Not on file  . Number of children: Not on file  . Years of education: Not on file  . Highest education level: Not on file  Occupational History  . Not on file  Social Needs  . Financial resource strain: Not on file  . Food insecurity:    Worry: Not on file    Inability: Not on file  . Transportation needs:    Medical: Not on file    Non-medical: Not on file  Tobacco Use  . Smoking status: Never Smoker  . Smokeless tobacco: Never Used  Substance and Sexual Activity  . Alcohol use: No    Alcohol/week: 0.0 oz  . Drug use: No  . Sexual activity: Never  Lifestyle  . Physical activity:    Days per week: Not on file    Minutes per session: Not on file  . Stress: Not on file  Relationships  . Social connections:    Talks on phone: Not on file    Gets together: Not on file    Attends religious service: Not on file    Active member of club or organization: Not on file    Attends meetings of clubs or organizations: Not on file    Relationship status: Not on file  . Intimate partner violence:   Fear of current or ex partner: Not on file    Emotionally abused: Not on file    Physically abused: Not on file    Forced sexual activity: Not on file  Other Topics Concern  . Not on file  Social History Narrative  . Not on file     Constitutional: Denies fever, malaise, fatigue, headache or abrupt weight changes.  HEENT: Denies eye pain, eye redness, ear pain, ringing in the ears, wax buildup, runny nose, nasal congestion, bloody nose, or sore throat. Respiratory: Denies difficulty breathing, shortness of breath, cough or sputum production.   Cardiovascular: Denies chest pain, chest tightness, palpitations or swelling in the hands or feet.  Gastrointestinal: Denies abdominal pain, bloating, constipation, diarrhea or blood in the stool.  GU: Pt reports amenorrhea (induced abortion 1 month ago). Denies urgency, frequency, pain with urination, burning sensation, blood in urine, odor or discharge. Musculoskeletal: Denies decrease in range of motion, difficulty with gait, muscle pain or joint pain and swelling.  Skin: Denies redness, rashes, lesions or ulcercations.  Neurological: Denies dizziness, difficulty with memory, difficulty with speech or problems with balance and coordination.  Psych: Denies anxiety, depression,  SI/HI.  No other specific complaints in a complete review of systems (except as listed in HPI above).  Objective:   Physical Exam   BP 106/68   Pulse 88   Temp 98.2 F (36.8 C) (Oral)   Ht 5' 1.5" (1.562 m)   Wt 142 lb (64.4 kg)   LMP 05/15/2017   SpO2 98%   BMI 26.40 kg/m  Wt Readings from Last 3 Encounters:  08/26/17 142 lb (64.4 kg)  07/19/17 138 lb 1.9 oz (62.7 kg)  04/20/17 130 lb (59 kg)    General: Appears her stated age, well developed, well nourished in NAD. Skin: Warm, dry and intact.  HEENT: Head: normal shape and size; Eyes: sclera white, no icterus, conjunctiva pink, PERRLA and EOMs intact; Ears: Tm's gray and intact, normal light reflex;  Throat/Mouth: Teeth present, mucosa pink and moist, no exudate, lesions or ulcerations noted.  Neck:  Neck supple, trachea midline. No masses, lumps or thyromegaly present.  Cardiovascular: Normal rate and rhythm. S1,S2 noted.  No murmur, rubs or gallops noted. No JVD or BLE edema. Pulmonary/Chest: Normal effort and positive vesicular breath sounds. No respiratory distress. No wheezes, rales or ronchi noted.  Abdomen: Soft and nontender. Normal bowel sounds. No distention or masses noted. Liver, spleen and kidneys non palpable. Musculoskeletal: Strength 5/5 BUE/BLE. No difficulty with gait.  Neurological: Alert and oriented. Cranial nerves II-XII grossly intact. Coordination normal.  Psychiatric: Mood and affect normal. Behavior is normal. Judgment and thought content normal.     BMET    Component Value Date/Time   NA 136 06/22/2016 1158   K 3.9 06/22/2016 1158   CL 100 06/22/2016 1158   CO2 28 06/22/2016 1158   GLUCOSE 97 06/22/2016 1158   BUN 13 06/22/2016 1158   CREATININE 0.71 06/22/2016 1158   CALCIUM 9.7 06/22/2016 1158   GFRNONAA NOT CALCULATED 04/11/2012 0614   GFRAA NOT CALCULATED 04/11/2012 0614    Lipid Panel     Component Value Date/Time   CHOL 151 06/22/2016 1158   TRIG 39.0 06/22/2016 1158   HDL 71.00 06/22/2016 1158   CHOLHDL 2 06/22/2016 1158   VLDL 7.8 06/22/2016 1158   LDLCALC 72 06/22/2016 1158    CBC    Component Value Date/Time   WBC 6.8 06/22/2016 1158   RBC 4.27 06/22/2016 1158   HGB 12.7 06/22/2016 1158   HCT 38.8 06/22/2016 1158   PLT 407.0 (H) 06/22/2016 1158   MCV 91.0 06/22/2016 1158   MCH 29.4 04/11/2012 0614   MCHC 32.6 06/22/2016 1158   RDW 13.6 06/22/2016 1158   LYMPHSABS 2.5 06/22/2016 1158   MONOABS 0.3 06/22/2016 1158   EOSABS 0.1 06/22/2016 1158   BASOSABS 0.1 06/22/2016 1158    Hgb A1C No results found for: HGBA1C         Assessment & Plan:   Preventative Health Maintenance:  Encouraged her to get a flu shot in  the fall She declines tetanus booster today She will make an appt for her pap smear Encouraged her to consume a balanced diet and exercise regimen Advised her to see an eye doctor and dentist annually Will check CBC, CMET, Lipid profile today  RTC in 1 year, sooner if needed Nicki Reaper, NP

## 2017-08-26 NOTE — Patient Instructions (Signed)

## 2017-09-16 ENCOUNTER — Encounter: Payer: Self-pay | Admitting: Internal Medicine

## 2017-09-16 ENCOUNTER — Other Ambulatory Visit (HOSPITAL_COMMUNITY)
Admission: RE | Admit: 2017-09-16 | Discharge: 2017-09-16 | Disposition: A | Discharge status: Home / Self Care | Payer: Self-pay | Source: Ambulatory Visit | Attending: Internal Medicine | Admitting: Internal Medicine

## 2017-09-16 ENCOUNTER — Ambulatory Visit (INDEPENDENT_AMBULATORY_CARE_PROVIDER_SITE_OTHER): Payer: Self-pay | Admitting: Internal Medicine

## 2017-09-16 DIAGNOSIS — Z124 Encounter for screening for malignant neoplasm of cervix: Secondary | ICD-10-CM

## 2017-09-16 DIAGNOSIS — Z01419 Encounter for gynecological examination (general) (routine) without abnormal findings: Secondary | ICD-10-CM

## 2017-09-16 DIAGNOSIS — R87612 Low grade squamous intraepithelial lesion on cytologic smear of cervix (LGSIL): Secondary | ICD-10-CM

## 2017-09-16 DIAGNOSIS — N870 Mild cervical dysplasia: Secondary | ICD-10-CM | POA: Insufficient documentation

## 2017-09-16 DIAGNOSIS — A749 Chlamydial infection, unspecified: Secondary | ICD-10-CM | POA: Insufficient documentation

## 2017-09-16 DIAGNOSIS — Z01411 Encounter for gynecological examination (general) (routine) with abnormal findings: Secondary | ICD-10-CM

## 2017-09-16 HISTORY — DX: Low grade squamous intraepithelial lesion on cytologic smear of cervix (LGSIL): R87.612

## 2017-09-16 NOTE — Addendum Note (Signed)
Addended by: Zollie PeeONDON, Otilia Kareem A on: 09/16/2017 08:33 AM   Modules accepted: Orders

## 2017-09-16 NOTE — Progress Notes (Signed)
Subjective:    Patient ID: Rose Mitchell, female    DOB: 01/18/1996, 22 y.o.   MRN: [id number]  HPI  Pt presents to the clinic today for her pap smear. She has never had a pap smear before. Her LMP was 08/30/2017. Her periods are regular. She is sexually active.  Review of Systems  Past Medical History:  Diagnosis Date  . UTI (urinary tract infection)     No current outpatient medications on file.   No current facility-administered medications for this visit.     Allergies  Allergen Reactions  . Amoxicillin Rash    Family History  Problem Relation Age of Onset  . Diabetes Father     Social History   Socioeconomic History  . Marital status: Single    Spouse name: Not on file  . Number of children: Not on file  . Years of education: Not on file  . Highest education level: Not on file  Occupational History  . Not on file  Social Needs  . Financial resource strain: Not on file  . Food insecurity:    Worry: Not on file    Inability: Not on file  . Transportation needs:    Medical: Not on file    Non-medical: Not on file  Tobacco Use  . Smoking status: Never Smoker  . Smokeless tobacco: Never Used  Substance and Sexual Activity  . Alcohol use: No    Alcohol/week: 0.0 oz  . Drug use: No  . Sexual activity: Never  Lifestyle  . Physical activity:    Days per week: Not on file    Minutes per session: Not on file  . Stress: Not on file  Relationships  . Social connections:    Talks on phone: Not on file    Gets together: Not on file    Attends religious service: Not on file    Active member of club or organization: Not on file    Attends meetings of clubs or organizations: Not on file    Relationship status: Not on file  . Intimate partner violence:    Fear of current or ex partner: Not on file    Emotionally abused: Not on file    Physically abused: Not on file    Forced sexual activity: Not on file  Other Topics Concern  . Not on file  Social History  Narrative  . Not on file     Constitutional: Denies fever, malaise, fatigue, headache or abrupt weight changes.  Gastrointestinal: Denies abdominal pain, bloating, constipation, diarrhea or blood in the stool.  GU: Denies urgency, frequency, pain with urination, burning sensation, blood in urine, odor or discharge.   No other specific complaints in a complete review of systems (except as listed in HPI above).     Objective:   Physical Exam   BP 98/60 (BP Location: Right Arm, Patient Position: Sitting, Cuff Size: Normal)   Pulse 69   Temp 97.7 F (36.5 C) (Oral)   Ht 5' 1.5" (1.562 m)   Wt 138 lb (62.6 kg)   LMP 08/30/2017   SpO2 98%   BMI 25.65 kg/m  Wt Readings from Last 3 Encounters:  09/16/17 138 lb (62.6 kg)  08/26/17 142 lb (64.4 kg)  07/19/17 138 lb 1.9 oz (62.7 kg)    General: Appears her stated age, well developed, well nourished in NAD. Abdomen: Soft and nontender. Normal bowel sounds.  Pelvic: Normal female anatomy. Moderate amount of thick white discharge note, no odor.  Cervix friable. No CMT noted. Adnexa non palpable.  BMET    Component Value Date/Time   NA 135 08/26/2017 1455   K 4.1 08/26/2017 1455   CL 102 08/26/2017 1455   CO2 27 08/26/2017 1455   GLUCOSE 92 08/26/2017 1455   BUN 15 08/26/2017 1455   CREATININE 0.69 08/26/2017 1455   CALCIUM 9.8 08/26/2017 1455   GFRNONAA NOT CALCULATED 04/11/2012 0614   GFRAA NOT CALCULATED 04/11/2012 0614    Lipid Panel     Component Value Date/Time   CHOL 123 08/26/2017 1455   TRIG 111.0 08/26/2017 1455   HDL 47.70 08/26/2017 1455   CHOLHDL 3 08/26/2017 1455   VLDL 22.2 08/26/2017 1455   LDLCALC 53 08/26/2017 1455    CBC    Component Value Date/Time   WBC 8.6 08/26/2017 1455   RBC 4.25 08/26/2017 1455   HGB 12.5 08/26/2017 1455   HCT 37.6 08/26/2017 1455   PLT 383.0 08/26/2017 1455   MCV 88.4 08/26/2017 1455   MCH 29.4 04/11/2012 0614   MCHC 33.2 08/26/2017 1455   RDW 13.2 08/26/2017 1455     LYMPHSABS 2.5 06/22/2016 1158   MONOABS 0.3 06/22/2016 1158   EOSABS 0.1 06/22/2016 1158   BASOSABS 0.1 06/22/2016 1158    Hgb A1C No results found for: HGBA1C         Assessment & Plan:   Screening for Cervical Cancer with Routine GYN Exam:  Pap smear today with STD screen Anticipatory guidance given re: safe sexual practices, birth control, condom use, STD prevention  RTC in 1 year for your annual exam Nicki Reaper, NP

## 2017-09-16 NOTE — Patient Instructions (Signed)

## 2017-09-20 ENCOUNTER — Other Ambulatory Visit: Payer: Self-pay | Admitting: Internal Medicine

## 2017-09-20 DIAGNOSIS — N870 Mild cervical dysplasia: Secondary | ICD-10-CM

## 2017-09-20 LAB — CYTOLOGY - PAP
BACTERIAL VAGINITIS: POSITIVE — AB
CANDIDA VAGINITIS: NEGATIVE
Chlamydia: POSITIVE — AB
Neisseria Gonorrhea: NEGATIVE
TRICH (WINDOWPATH): NEGATIVE

## 2017-09-20 LAB — CERVICOVAGINAL ANCILLARY ONLY: Herpes: NEGATIVE

## 2017-09-20 MED ORDER — AZITHROMYCIN 500 MG PO TABS: 1000.0000 mg | ORAL_TABLET | Freq: Once | ORAL | 0 refills | Status: AC

## 2017-09-20 MED ORDER — METRONIDAZOLE 0.75 % VA GEL: 1.0000 | Freq: Two times a day (BID) | VAGINAL | 0 refills | Status: DC

## 2017-09-22 MED ORDER — AZITHROMYCIN 500 MG PO TABS: 1000.0000 mg | ORAL_TABLET | Freq: Once | ORAL | 0 refills | Status: AC

## 2017-09-22 NOTE — Addendum Note (Signed)
Addended by: Roena Malady on: 09/22/2017 07:10 PM   Modules accepted: Orders

## 2017-09-23 ENCOUNTER — Telehealth: Payer: Self-pay

## 2017-09-23 NOTE — Telephone Encounter (Signed)
Left message for patient to call Kayn Haymore back in regards to a referral-Layce Sprung V Leonte Horrigan, RMA   

## 2017-09-30 ENCOUNTER — Ambulatory Visit: Payer: Self-pay | Admitting: Internal Medicine

## 2017-10-05 ENCOUNTER — Encounter: Payer: Self-pay | Admitting: Obstetrics & Gynecology

## 2017-10-05 ENCOUNTER — Ambulatory Visit (INDEPENDENT_AMBULATORY_CARE_PROVIDER_SITE_OTHER): Payer: Self-pay | Admitting: Obstetrics & Gynecology

## 2017-10-05 VITALS — BP 110/73 | HR 75 | Ht 61.0 in | Wt 138.0 lb

## 2017-10-05 DIAGNOSIS — R87612 Low grade squamous intraepithelial lesion on cytologic smear of cervix (LGSIL): Secondary | ICD-10-CM

## 2017-10-05 DIAGNOSIS — A749 Chlamydial infection, unspecified: Secondary | ICD-10-CM

## 2017-10-05 DIAGNOSIS — Z23 Encounter for immunization: Secondary | ICD-10-CM

## 2017-10-05 DIAGNOSIS — Z708 Other sex counseling: Secondary | ICD-10-CM

## 2017-10-05 NOTE — Patient Instructions (Signed)
Thank you for enrolling in MyChart. Please follow the instructions below to securely access your online medical record. MyChart allows you to send messages to your doctor, view your test results, manage appointments, and more.   How Do I Sign Up? 1. In your Internet browser, go to [employer] and enter [email or web address]. 2. Click on the Sign Up Now link in the Sign In box. You will see the New Member Sign Up page. 3. Enter your MyChart Access Code exactly as it appears below. You will not need to use this code after you've completed the sign-up process. If you do not sign up before the expiration date, you must request a new code.  MyChart Access Code: [email or web address] Expires: 10/31/2017  8:22 AM  4. Enter your Social Security Number ([id number]) and Date of Birth (mm/dd/yyyy) as indicated and click Submit. You will be taken to the next sign-up page. 5. Create a MyChart ID. This will be your MyChart login ID and cannot be changed, so think of one that is secure and easy to remember. 6. Create a MyChart password. You can change your password at any time. 7. Enter your Password Reset Question and Answer. This can be used at a later time if you forget your password.  8. Enter your e-mail address. You will receive e-mail notification when new information is available in MyChart. 9. Click Sign Up. You can now view your medical record.   Additional Information Remember, MyChart is NOT to be used for urgent needs. For medical emergencies, dial 911.

## 2017-10-05 NOTE — Progress Notes (Signed)
   GYNECOLOGY OFFICE VISIT NOTE  History:  22 y.o. G1P0010 here today after being referred for management of recent abnormal pap smear; had LGSIL pap in 08/2017. Was also diagnosed with chlamydia on same pap.  Also had recent TAB in 07/2017.  She denies any current abnormal vaginal discharge, bleeding, pelvic pain or other concerns.   Past Medical History:  Diagnosis Date  . Chlamydia infection    03/2015, 05/2016 and 08/2017  . 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 UTI (urinary tract infection)     History reviewed. No pertinent surgical history.  The following portions of the patient's history were reviewed and updated as appropriate: allergies, current medications, past family history, past medical history, past social history, past surgical history and problem list.   Health Maintenance:  LGSIL pap on 09/16/2017.    Review of Systems:  Pertinent items noted in HPI and remainder of comprehensive ROS otherwise negative.  Objective:  Physical Exam BP 110/73   Pulse 75   Ht  (1.549 m)   Wt 138 lb (62.6 kg)   LMP 09/28/2017   BMI 26.07 kg/m  CONSTITUTIONAL: Well-developed, well-nourished female in no acute distress.  SKIN: Skin is warm and dry. No rash noted. Not diaphoretic. No erythema. No pallor. NEUROLOGIC: Alert and oriented to person, place, and time. Normal reflexes, muscle tone coordination. No cranial nerve deficit noted. PSYCHIATRIC: Normal mood and affect. Normal behavior. Normal judgment and thought content. CARDIOVASCULAR: Normal heart rate noted RESPIRATORY: Effort and breath sounds normal, no problems with respiration noted ABDOMEN: Soft, no distention noted.   PELVIC: Deferred MUSCULOSKELETAL: Normal range of motion. No edema noted.  Labs and Imaging No results found.  Assessment & Plan:  1. LGSIL on Pap smear of cervix Had a long discussion about cervical dysplasia and association with HPV; need for surveillance.  Given her age, ASCCP guidelines  recommends repeat cotesting (pap and HPV) in 12 months; this will be communicated to her PCP.  On her immunization record, patient only has one entry for HPV vaccine in 2008; she does not recall getting multiple doses. Counseled about need for vaccination, she agreed with this plan. 1st injection to be given today, will return in one month for Gardasil #2 (unable to come in 2 months due to [employer] training) and 03/2018 for Gardasil #3.   - HPV 9-valent vaccine,Recombinat  2. Chlamydia infection Had a long discussion about STIs and need for safe sex with condom use 100% of the time. She declines hormonal contraception or LARCs (had a recent TAB); so condoms also emphasized for contraception.  Discussed effects STI on the reproductive system; could cause pelvic pain, PID, infertility issues etc.  Serum testing for other STIs recommended; patient agrees with this. Will follow up results and manage accordingly. - Hepatitis B surface antigen - Hepatitis C antibody - RPR - HIV antibody  Please refer to After Visit Summary for other counseling recommendations.   Return in about 1 week (around 10/12/2017) for Gardasil #2; and 03/2018 Gardasil #3 (RN visits).   Total face-to-face time with patient: 20 minutes.  Over 50% of encounter was spent on counseling and coordination of care.   Jaynie Collins, MD, FACOG Obstetrician & Gynecologist, [facility] for [employer], [facility], [facility]

## 2017-10-06 LAB — HIV ANTIBODY (ROUTINE TESTING W REFLEX): HIV SCREEN 4TH GENERATION: NONREACTIVE

## 2017-10-06 LAB — HEPATITIS B SURFACE ANTIGEN: Hepatitis B Surface Ag: NEGATIVE

## 2017-10-06 LAB — HEPATITIS C ANTIBODY

## 2017-10-06 LAB — RPR: RPR: NONREACTIVE

## 2017-11-05 ENCOUNTER — Ambulatory Visit (INDEPENDENT_AMBULATORY_CARE_PROVIDER_SITE_OTHER): Payer: Self-pay

## 2017-11-05 VITALS — BP 127/80 | HR 72

## 2017-11-05 DIAGNOSIS — Z23 Encounter for immunization: Secondary | ICD-10-CM

## 2017-11-05 NOTE — Progress Notes (Signed)
Patient presented to the office today for her gardisil injection received in left arm. Patient tolerated well and well follow up in November for next injection.

## 2017-11-05 NOTE — Progress Notes (Signed)
Patient seen and assessed by nursing staff.  Agree with documentation and plan.  

## 2018-04-04 ENCOUNTER — Ambulatory Visit (INDEPENDENT_AMBULATORY_CARE_PROVIDER_SITE_OTHER): Payer: Self-pay | Admitting: Internal Medicine

## 2018-04-04 ENCOUNTER — Encounter: Payer: Self-pay | Admitting: Internal Medicine

## 2018-04-04 VITALS — BP 120/76 | HR 88 | Temp 98.5°F | Wt 150.0 lb

## 2018-04-04 DIAGNOSIS — Z23 Encounter for immunization: Secondary | ICD-10-CM

## 2018-04-04 DIAGNOSIS — N898 Other specified noninflammatory disorders of vagina: Secondary | ICD-10-CM

## 2018-04-04 DIAGNOSIS — Z113 Encounter for screening for infections with a predominantly sexual mode of transmission: Secondary | ICD-10-CM

## 2018-04-04 DIAGNOSIS — B3731 Acute candidiasis of vulva and vagina: Secondary | ICD-10-CM

## 2018-04-04 DIAGNOSIS — B373 Candidiasis of vulva and vagina: Secondary | ICD-10-CM

## 2018-04-04 MED ORDER — FLUCONAZOLE 150 MG PO TABS: 150.0000 mg | ORAL_TABLET | Freq: Once | ORAL | 0 refills | Status: AC

## 2018-04-04 NOTE — Progress Notes (Signed)
Subjective:    Patient ID: Rose Mitchell, female    DOB: [birth year], 22 y.o.   MRN: [id number]  HPI  Pt presents to the clinic today with c/o vaginal itching. She reports this started 2-3 days ago. She also reports vaginal discharge. The discharge is thick, white without odor. She denies abnormal vaginal bleeding, pelvic pain, low back pain, fever, or chills. She denies urgency, frequency, dysuria or blood in her urine. She reports she is sexually active. She did have unprotected intercourse 2 weeks ago. She would like to be screened for STD's today.  Review of Systems      Past Medical History:  Diagnosis Date  . Chlamydia infection    03/2015, 05/2016 and 08/2017  . 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 UTI (urinary tract infection)     Current Outpatient Medications  Medication Sig Dispense Refill  . fluconazole (DIFLUCAN) 150 MG tablet Take 1 tablet (150 mg total) by mouth once for 1 dose. May repeat in 3 days if needed 2 tablet 0   No current facility-administered medications for this visit.     Allergies  Allergen Reactions  . Amoxicillin Rash    Family History  Problem Relation Age of Onset  . Hypertension Mother   . Diabetes Father   . Prostate cancer Maternal Grandfather     Social History   Socioeconomic History  . Marital status: Single    Spouse name: Not on file  . Number of children: Not on file  . Years of education: Not on file  . Highest education level: Not on file  Occupational History  . Not on file  Social Needs  . Financial resource strain: Not on file  . Food insecurity:    Worry: Not on file    Inability: Not on file  . Transportation needs:    Medical: Not on file    Non-medical: Not on file  Tobacco Use  . Smoking status: Never Smoker  . Smokeless tobacco: Never Used  Substance and Sexual Activity  . Alcohol use: Yes    Alcohol/week: 0.0 standard drinks    Comment: Shots on weekend  . Drug use: No  . Sexual activity: Yes   Partners: Male    Birth control/protection: None  Lifestyle  . Physical activity:    Days per week: Not on file    Minutes per session: Not on file  . Stress: Not on file  Relationships  . Social connections:    Talks on phone: Not on file    Gets together: Not on file    Attends religious service: Not on file    Active member of club or organization: Not on file    Attends meetings of clubs or organizations: Not on file    Relationship status: Not on file  . Intimate partner violence:    Fear of current or ex partner: Not on file    Emotionally abused: Not on file    Physically abused: Not on file    Forced sexual activity: Not on file  Other Topics Concern  . Not on file  Social History Narrative  . Not on file     Constitutional: Denies fever, malaise, fatigue, headache or abrupt weight changes.  Gastrointestinal: Denies abdominal pain, bloating, constipation, diarrhea or blood in the stool.  GU: Pt reports vaginal itching and discharge. Denies urgency, frequency, pain with urination, burning sensation, blood in urine, odor.  No other specific complaints in a complete review  of systems (except as listed in HPI above).  Objective:   Physical Exam   BP 120/76   Pulse 88   Temp 98.5 F (36.9 C) (Oral)   Wt 150 lb (68 kg)   LMP 03/07/2018   SpO2 98%   BMI 28.34 kg/m  Wt Readings from Last 3 Encounters:  04/04/18 150 lb (68 kg)  10/05/17 138 lb (62.6 kg)  09/16/17 138 lb (62.6 kg)    General: Appears her stated age, well developed, well nourished in NAD. Cardiovascular: Normal rate and rhythm. Abdomen: Soft and nontender. Normal bowel sounds. No distention or masses noted.  Pelvic: Self swabbed.  BMET    Component Value Date/Time   NA 135 08/26/2017 1455   K 4.1 08/26/2017 1455   CL 102 08/26/2017 1455   CO2 27 08/26/2017 1455   GLUCOSE 92 08/26/2017 1455   BUN 15 08/26/2017 1455   CREATININE 0.69 08/26/2017 1455   CALCIUM 9.8 08/26/2017 1455    GFRNONAA NOT CALCULATED 04/11/2012 0614   GFRAA NOT CALCULATED 04/11/2012 0614    Lipid Panel     Component Value Date/Time   CHOL 123 08/26/2017 1455   TRIG 111.0 08/26/2017 1455   HDL 47.70 08/26/2017 1455   CHOLHDL 3 08/26/2017 1455   VLDL 22.2 08/26/2017 1455   LDLCALC 53 08/26/2017 1455    CBC    Component Value Date/Time   WBC 8.6 08/26/2017 1455   RBC 4.25 08/26/2017 1455   HGB 12.5 08/26/2017 1455   HCT 37.6 08/26/2017 1455   PLT 383.0 08/26/2017 1455   MCV 88.4 08/26/2017 1455   MCH 29.4 04/11/2012 0614   MCHC 33.2 08/26/2017 1455   RDW 13.2 08/26/2017 1455   LYMPHSABS 2.5 06/22/2016 1158   MONOABS 0.3 06/22/2016 1158   EOSABS 0.1 06/22/2016 1158   BASOSABS 0.1 06/22/2016 1158    Hgb A1C No results found for: HGBA1C         Assessment & Plan:   Vaginal Itching, Discharge secondary to Vaginal Candidiasis, Screening for STD:  Wet prep: + yeast, no clues cells, trich eRx for Diflucan 150 mg PO x 1, may repeat in 3 days if needed Avoid bubble baths, feminine washes, douches or loofah's Offered serum Hcg to check for pregnancy due to recent unprotected sex, she declines at this time Will check urine for gonorrhea, chlamydia and trich  Will follow up after labs, return precautions discussed Nicki Reaper, NP

## 2018-04-04 NOTE — Patient Instructions (Signed)

## 2018-04-05 NOTE — Addendum Note (Signed)
Addended by: Roena Malady on: 04/05/2018 10:31 AM   Modules accepted: Orders

## 2018-04-06 ENCOUNTER — Other Ambulatory Visit: Payer: Self-pay | Admitting: Internal Medicine

## 2018-04-06 LAB — C. TRACHOMATIS/N. GONORRHOEAE RNA
C. trachomatis RNA, TMA: DETECTED — AB
N. gonorrhoeae RNA, TMA: NOT DETECTED

## 2018-04-06 MED ORDER — AZITHROMYCIN 500 MG PO TABS: 1000.0000 mg | ORAL_TABLET | Freq: Once | ORAL | 0 refills | Status: AC

## 2018-04-07 ENCOUNTER — Ambulatory Visit (INDEPENDENT_AMBULATORY_CARE_PROVIDER_SITE_OTHER): Payer: Self-pay | Admitting: Obstetrics & Gynecology

## 2018-04-07 DIAGNOSIS — Z7189 Other specified counseling: Secondary | ICD-10-CM

## 2018-04-07 DIAGNOSIS — Z23 Encounter for immunization: Secondary | ICD-10-CM

## 2018-04-07 NOTE — Progress Notes (Signed)
I have reviewed the chart and agree with nursing staff's documentation of this patient's encounter.  Jaynie CollinsUgonna Jacquan Savas, MD 04/07/2018 9:09 AM

## 2018-04-07 NOTE — Progress Notes (Signed)
Patient presented to the clinic today for her #3 Hpv# injection given in left arm IM. NDC# [id number]. Patient tolerated well. She has completed the HPV series.

## 2018-04-08 ENCOUNTER — Encounter: Payer: Self-pay | Admitting: Obstetrics & Gynecology

## 2018-08-23 ENCOUNTER — Other Ambulatory Visit: Payer: Self-pay

## 2018-08-23 ENCOUNTER — Encounter (HOSPITAL_COMMUNITY): Payer: Self-pay | Admitting: Emergency Medicine

## 2018-08-23 ENCOUNTER — Emergency Department (HOSPITAL_COMMUNITY)
Admission: EM | Admit: 2018-08-23 | Discharge: 2018-08-23 | Disposition: A | Discharge status: Home / Self Care | Payer: Self-pay | Attending: Emergency Medicine | Admitting: Emergency Medicine

## 2018-08-23 DIAGNOSIS — Z2089 Contact with and (suspected) exposure to other communicable diseases: Secondary | ICD-10-CM | POA: Insufficient documentation

## 2018-08-23 DIAGNOSIS — Z20828 Contact with and (suspected) exposure to other viral communicable diseases: Secondary | ICD-10-CM

## 2018-08-23 DIAGNOSIS — Z20822 Contact with and (suspected) exposure to covid-19: Secondary | ICD-10-CM

## 2018-08-23 NOTE — ED Provider Notes (Signed)
[facility] EMERGENCY DEPARTMENT Provider Note   CSN: [id number] Arrival date & time: 08/23/18  1959    History   Chief Complaint No chief complaint on file.   HPI Rose Mitchell is a 23 y.o. female who presents today requesting testing for COVID-19.  She reports that her roommate was informed today that she was positive.  Patient says this happened approximately half an hour prior to arrival.  Patient does not have any symptoms currently.  She denies fever, cough, shortness of breath or other symptoms, stating she is only here to get tested.     HPI  Past Medical History:  Diagnosis Date  . Chlamydia infection    03/2015, 05/2016 and 08/2017  . 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 UTI (urinary tract infection)     Patient Active Problem List   Diagnosis Date Noted  . 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 No pertinent surgical history.   OB History    Gravida  1   Para      Term      Preterm      AB  1   Living        SAB      TAB  1   Ectopic      Multiple      Live Births               Home Medications    Prior to Admission medications   Not on File    Family History Family History  Problem Relation Age of Onset  . Hypertension Mother   . Diabetes Father   . Prostate cancer Maternal Grandfather     Social History Social History   Tobacco Use  . Smoking status: Never Smoker  . Smokeless tobacco: Never Used  Substance Use Topics  . Alcohol use: Yes    Alcohol/week: 0.0 standard drinks    Comment: Shots on weekend  . Drug use: No     Allergies   Amoxicillin   Review of Systems Review of Systems  Constitutional: Negative for chills and fever.  HENT: Negative for congestion.   Respiratory: Negative for cough, choking and shortness of breath.   All other systems reviewed and are negative.    Physical Exam Updated Vital Signs BP 128/84 (BP Location: Right Arm)   Pulse 86    Temp 98.3 F (36.8 C) (Oral)   Resp 16   LMP 07/22/2018   SpO2 100%   Physical Exam Vitals signs and nursing note reviewed.  Constitutional:      General: She is not in acute distress.    Appearance: She is not ill-appearing.  HENT:     Head: Normocephalic.  Cardiovascular:     Rate and Rhythm: Normal rate.  Pulmonary:     Effort: Pulmonary effort is normal. No respiratory distress.  Neurological:     General: No focal deficit present.     Mental Status: She is alert.  Psychiatric:        Mood and Affect: Mood normal.        Behavior: Behavior normal.      ED Treatments / Results  Labs (all labs ordered are listed, but only abnormal results are displayed) Labs Reviewed - No data to display  EKG None  Radiology No results found.  Procedures Procedures (including critical care time)  Medications Ordered in ED Medications - No data to display  Initial Impression / Assessment and Plan / ED Course  I have reviewed the triage vital signs and the nursing notes.  Pertinent labs & imaging results that were available during my care of the patient were reviewed by me and considered in my medical decision making (see chart for details).        Rose Mitchell was evaluated in Emergency Department on 08/23/2018 for the symptoms described in the history of present illness. She was evaluated in the context of the global COVID-19 pandemic, which necessitated consideration that the patient might be at risk for infection with the SARS-CoV-2 virus that causes COVID-19. Institutional protocols and algorithms that pertain to the evaluation of patients at risk for COVID-19 are in a state of rapid change based on information released by regulatory bodies including the CDC and federal and state organizations. These policies and algorithms were followed during the patient's care in the ED.  Patient presents today requesting coronavirus testing.  She reports that approximately half an hour  ago she was told her roommate tested positive for COVID-19.  She does not have any symptoms at this time.  I informed her that, given current guidelines we are unable to offer her testing from the emergency room.  I instructed her that, given she has had a close contact, she should quarantine herself for a minimum of 7 days.  We discussed that if she develops symptoms she needs to quarantine for 7 days from the onset of symptoms and remain quarantined until she has been symptom-free without medications for at least 72 hours.  She is given a work note.  Conservative care discussed for if she develops symptoms.  Return precautions were discussed with patient who states their understanding.  At the time of discharge patient denied any unaddressed complaints or concerns.  Patient is agreeable for discharge home.   Final Clinical Impressions(s) / ED Diagnoses   Final diagnoses:  Close Exposure to 2019 Novel Coronavirus    ED Discharge Orders    None       Norman Clay 08/23/18 2027    Shaune Pollack, MD 08/24/18 1158

## 2018-08-23 NOTE — ED Notes (Signed)
Discharge instructions discussed with Pt. Pt verbalized understanding. Pt stable and ambulatory.    

## 2018-08-23 NOTE — ED Triage Notes (Signed)
Pt reports her roommate tested positive for COVID-19, denies having any symptoms herself. She wants to be tested.

## 2018-08-23 NOTE — Discharge Instructions (Addendum)
Person Under Monitoring Name: Rose Mitchell  Location: [address] [place] [address]   Infection Prevention Recommendations for Individuals Confirmed to have, or Being Evaluated for, 2019 Novel Coronavirus (COVID-19) Infection Who Receive Care at Home  Individuals who are confirmed to have, or are being evaluated for, COVID-19 should follow the prevention steps below until a healthcare provider or local or state health department says they can return to normal activities.  Stay home except to get medical care You should restrict activities outside your home, except for getting medical care. Do not go to work, school, or public areas, and do not use public transportation or taxis.  Call ahead before visiting your doctor Before your medical appointment, call the healthcare provider and tell them that you have, or are being evaluated for, COVID-19 infection. This will help the healthcare providers office take steps to keep other people from getting infected. Ask your healthcare provider to call the local or state health department.  As we discussed today you need to quarantine for a minimum of 7 days.  If you do not develop symptoms during that time you can consider returning to activities within the constrictions of the stay at home laws.   If you develop symptoms you need to reset your quarantine and remain at home in quarantine for a minimum of 7 days from the start of your symptoms.  After that you may return to activities once you have been symptom and fever free, without the use of medications, for at least 72 hours.  Please read through the information listed below.  As we discussed you can return to the emergency room if you develop severe shortness of breath, are unable to walk more than a few steps without having to stop to breathe, or develop blue fingernails or lips with shortness of breath.  Monitor your symptoms Seek prompt medical attention if your  illness is worsening (e.g., difficulty breathing). Before going to your medical appointment, call the healthcare provider and tell them that you have, or are being evaluated for, COVID-19 infection. Ask your healthcare provider to call the local or state health department.  Wear a facemask You should wear a facemask that covers your nose and mouth when you are in the same room with other people and when you visit a healthcare provider. People who live with or visit you should also wear a facemask while they are in the same room with you.  Separate yourself from other people in your home As much as possible, you should stay in a different room from other people in your home. Also, you should use a separate bathroom, if available.  Avoid sharing household items You should not share dishes, drinking glasses, cups, eating utensils, towels, bedding, or other items with other people in your home. After using these items, you should wash them thoroughly with soap and water.  Cover your coughs and sneezes Cover your mouth and nose with a tissue when you cough or sneeze, or you can cough or sneeze into your sleeve. Throw used tissues in a lined trash can, and immediately wash your hands with soap and water for at least 20 seconds or use an alcohol-based hand rub.  Wash your [employer] your hands often and thoroughly with soap and water for at least 20 seconds. You can use an alcohol-based hand sanitizer if soap and water are not available and if your hands are not visibly dirty. Avoid touching your eyes, nose, and  mouth with unwashed hands.   Prevention Steps for Caregivers and Household Members of Individuals Confirmed to have, or Being Evaluated for, COVID-19 Infection Being Cared for in the Home  If you live with, or provide care at home for, a person confirmed to have, or being evaluated for, COVID-19 infection please follow these guidelines to prevent infection:  Follow healthcare  providers instructions Make sure that you understand and can help the patient follow any healthcare provider instructions for all care.  Provide for the patients basic needs You should help the patient with basic needs in the home and provide support for getting groceries, prescriptions, and other personal needs.  Monitor the patients symptoms If they are getting sicker, call his or her medical provider and tell them that the patient has, or is being evaluated for, COVID-19 infection. This will help the healthcare providers office take steps to keep other people from getting infected. Ask the healthcare provider to call the local or state health department.  Limit the number of people who have contact with the patient If possible, have only one caregiver for the patient. Other household members should stay in another home or place of residence. If this is not possible, they should stay in another room, or be separated from the patient as much as possible. Use a separate bathroom, if available. Restrict visitors who do not have an essential need to be in the home.  Keep older adults, very young children, and other sick people away from the patient Keep older adults, very young children, and those who have compromised immune systems or chronic health conditions away from the patient. This includes people with chronic heart, lung, or kidney conditions, diabetes, and cancer.  Ensure good ventilation Make sure that shared spaces in the home have good air flow, such as from an air conditioner or an opened window, weather permitting.  Wash your hands often Wash your hands often and thoroughly with soap and water for at least 20 seconds. You can use an alcohol based hand sanitizer if soap and water are not available and if your hands are not visibly dirty. Avoid touching your eyes, nose, and mouth with unwashed hands. Use disposable paper towels to dry your hands. If not available, use  dedicated cloth towels and replace them when they become wet.  Wear a facemask and gloves Wear a disposable facemask at all times in the room and gloves when you touch or have contact with the patients blood, body fluids, and/or secretions or excretions, such as sweat, saliva, sputum, nasal mucus, vomit, urine, or feces.  Ensure the mask fits over your nose and mouth tightly, and do not touch it during use. Throw out disposable facemasks and gloves after using them. Do not reuse. Wash your hands immediately after removing your facemask and gloves. If your personal clothing becomes contaminated, carefully remove clothing and launder. Wash your hands after handling contaminated clothing. Place all used disposable facemasks, gloves, and other waste in a lined container before disposing them with other household waste. Remove gloves and wash your hands immediately after handling these items.  Do not share dishes, glasses, or other household items with the patient Avoid sharing household items. You should not share dishes, drinking glasses, cups, eating utensils, towels, bedding, or other items with a patient who is confirmed to have, or being evaluated for, COVID-19 infection. After the person uses these items, you should wash them thoroughly with soap and water.  Wash laundry thoroughly Immediately remove and wash  clothes or bedding that have blood, body fluids, and/or secretions or excretions, such as sweat, saliva, sputum, nasal mucus, vomit, urine, or feces, on them. Wear gloves when handling laundry from the patient. Read and follow directions on labels of laundry or clothing items and detergent. In general, wash and dry with the warmest temperatures recommended on the label.  Clean all areas the individual has used often Clean all touchable surfaces, such as counters, tabletops, doorknobs, bathroom fixtures, toilets, phones, keyboards, tablets, and bedside tables, every day. Also, clean any  surfaces that may have blood, body fluids, and/or secretions or excretions on them. Wear gloves when cleaning surfaces the patient has come in contact with. Use a diluted bleach solution (e.g., dilute bleach with 1 part bleach and 10 parts water) or a household disinfectant with a label that says EPA-registered for coronaviruses. To make a bleach solution at home, add 1 tablespoon of bleach to 1 quart (4 cups) of water. For a larger supply, add  cup of bleach to 1 gallon (16 cups) of water. Read labels of cleaning products and follow recommendations provided on product labels. Labels contain instructions for safe and effective use of the cleaning product including precautions you should take when applying the product, such as wearing gloves or eye protection and making sure you have good ventilation during use of the product. Remove gloves and wash hands immediately after cleaning.  Monitor yourself for signs and symptoms of illness Caregivers and household members are considered close contacts, should monitor their health, and will be asked to limit movement outside of the home to the extent possible. Follow the monitoring steps for close contacts listed on the symptom monitoring form.   ? If you have additional questions, contact your local health department or call the epidemiologist on call at [phone] (available 24/7). ? This guidance is subject to change. For the most up-to-date guidance from [facility], please refer to their website: [email or web address]

## 2018-08-30 ENCOUNTER — Ambulatory Visit (INDEPENDENT_AMBULATORY_CARE_PROVIDER_SITE_OTHER): Admitting: Internal Medicine

## 2018-08-30 ENCOUNTER — Encounter: Payer: Self-pay | Admitting: Internal Medicine

## 2018-08-30 DIAGNOSIS — Z20822 Contact with and (suspected) exposure to covid-19: Secondary | ICD-10-CM

## 2018-08-30 DIAGNOSIS — Z20828 Contact with and (suspected) exposure to other viral communicable diseases: Secondary | ICD-10-CM

## 2018-08-30 NOTE — Patient Instructions (Signed)
° ° °  Person Under Monitoring Name: Rose Mitchell  Location: [address] [place] [address]   CORONAVIRUS DISEASE 2019 (COVID-19) Guidance for Persons Under Investigation You are being tested for the virus that causes coronavirus disease 2019 (COVID-19). Public health actions are necessary to ensure protection of your health and the health of others, and to prevent further spread of infection. COVID-19 is caused by a virus that can cause symptoms, such as fever, cough, and shortness of breath. The primary transmission from person to person is by coughing or sneezing. On June 23, 2018, the World Health Organization announced a [employer] Emergency of International Concern and on June 24, 2018 the [place] Department of Health and Human Services declared a public health emergency. If the virus that causesCOVID-19 spreads in the community, it could have severe public health consequences.  As a person under investigation for COVID-19, the [employer] of Health and [employer], Division of [employer] advises you to adhere to the following guidance until your test results are reported to you. If your test result is positive, you will receive additional information from your provider and your local health department at that time.   Remain at home until you are cleared by your health provider or public health authorities.   Keep a log of visitors to your home using the form provided. Any visitors to your home must be aware of your isolation status.  If you plan to move to a new address or leave the county, notify the local health department in your county.  Call a doctor or seek care if you have an urgent medical need. Before seeking medical care, call ahead and get instructions from the provider before arriving at the medical office, clinic or hospital. Notify them that you are being tested for the virus that causes COVID-19 so arrangements can be made, as  necessary, to prevent transmission to others in the healthcare setting. Next, notify the local health department in your county.  If a medical emergency arises and you need to call 911, inform the first responders that you are being tested for the virus that causes COVID-19. Next, notify the local health department in your county.  Adhere to all guidance set forth by the [facility] Division of [employer] for [facility] of patients that is based on guidance from the Center for Disease Control and Prevention with suspected or confirmed COVID-19. It is provided with this guidance for Persons Under Investigation.  Your health and the health of our community are our top priorities. Public Health officials remain available to provide assistance and counseling to you about COVID-19 and compliance with this guidance.  Provider: ____________________________________________________________ Date: ______/_____/_________  By signing below, you acknowledge that you have read and agree to comply with this Guidance for Persons Under Investigation. ______________________________________________________________ Date: ______/_____/_________  WHO DO I CALL? You can find a list of local health departments here: [email or web address] Health Department: ____________________________________________________________________ Contact Name: ________________________________________________________________________ Telephone: ___________________________________________________________________________  Nedra Hai, Division of Public Health, Communicable Disease Branch COVID-19 Guidance for Persons Under Investigation July 30, 2018

## 2018-08-30 NOTE — Progress Notes (Signed)
Virtual Visit via Video Note  I connected with Rose Mitchell on 08/30/18 at  3:00 PM EDT by a video enabled telemedicine application and verified that I am speaking with the correct person using two identifiers.   I discussed the limitations of evaluation and management by telemedicine and the availability of in person appointments. The patient expressed understanding and agreed to proceed.  History of Present Illness:  Pt due for ER follow up.  She went to the ER 3/31 requesting COVID 19 testing. She was told shortly prior to that visit, that her roommate had tested positive for the virus. Because she was asymptomatic, she was not tested. No labwork was obtained. No chest xray was performed. She was advised to self quarantine. Since discharge, she reports she is feeling fine. She has no headache, runny nose, nasal congestion, ear pain, sore throat, cough or SOB. She denies fever, chills or body aches.  Observations/Objective:  Alert and oriented NAD No obvious cough, congestion or SOB Judgement and thought content are normal  Assessment and Plan:  ER Follow up for Covid 19 Exposure:  ER notes reviewed Continues to be asymptomatic Continue quarantine for 7 days as suggested by ER Will monitor  Follow Up Instructions:    I discussed the assessment and treatment plan with the patient. The patient was provided an opportunity to ask questions and all were answered. The patient agreed with the plan and demonstrated an understanding of the instructions.   The patient was advised to call back or seek an in-person evaluation if the symptoms worsen or if the condition fails to improve as anticipated.    Nicki Reaper, NP

## 2019-11-15 ENCOUNTER — Telehealth: Payer: Self-pay | Admitting: General Practice

## 2019-11-15 NOTE — Telephone Encounter (Signed)
Removed Dr. Aron as patient's PCP due to her leaving this practice and patient has not been seen in 3 years.
